# Patient Record
Sex: Male | Born: 1995 | Race: Black or African American | Hispanic: No | Marital: Single | Smoking: Current every day smoker
Health system: Southern US, Community
[De-identification: ages and names within clinical notes are randomized; demographics above are authoritative.]

## PROBLEM LIST (undated history)

## (undated) DIAGNOSIS — F1290 Cannabis use, unspecified, uncomplicated: Secondary | ICD-10-CM

## (undated) DIAGNOSIS — F17210 Nicotine dependence, cigarettes, uncomplicated: Secondary | ICD-10-CM

---
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
## 2021-06-13 ENCOUNTER — Encounter (HOSPITAL_COMMUNITY): Payer: Self-pay | Admitting: Emergency Medicine

## 2021-06-13 ENCOUNTER — Observation Stay (HOSPITAL_COMMUNITY)
Admission: EM | Admit: 2021-06-13 | Discharge: 2021-06-14 | Disposition: A | Discharge status: Home / Self Care | Payer: PRIVATE HEALTH INSURANCE | Attending: Emergency Medicine | Admitting: Emergency Medicine

## 2021-06-13 DIAGNOSIS — F1290 Cannabis use, unspecified, uncomplicated: Secondary | ICD-10-CM | POA: Diagnosis not present

## 2021-06-13 DIAGNOSIS — D72829 Elevated white blood cell count, unspecified: Secondary | ICD-10-CM | POA: Diagnosis not present

## 2021-06-13 DIAGNOSIS — R0902 Hypoxemia: Secondary | ICD-10-CM | POA: Diagnosis not present

## 2021-06-13 DIAGNOSIS — Z20822 Contact with and (suspected) exposure to covid-19: Secondary | ICD-10-CM | POA: Insufficient documentation

## 2021-06-13 DIAGNOSIS — F17210 Nicotine dependence, cigarettes, uncomplicated: Secondary | ICD-10-CM | POA: Diagnosis present

## 2021-06-13 DIAGNOSIS — R0602 Shortness of breath: Secondary | ICD-10-CM | POA: Diagnosis present

## 2021-06-13 DIAGNOSIS — J45901 Unspecified asthma with (acute) exacerbation: Secondary | ICD-10-CM | POA: Diagnosis not present

## 2021-06-13 HISTORY — DX: Cannabis use, unspecified, uncomplicated: F12.90

## 2021-06-13 HISTORY — DX: Nicotine dependence, cigarettes, uncomplicated: F17.210

## 2021-06-13 MED ORDER — ALBUTEROL SULFATE HFA 108 (90 BASE) MCG/ACT IN AERS: 2.0000 | INHALATION_SPRAY | RESPIRATORY_TRACT | Status: DC | PRN

## 2021-06-13 NOTE — ED Triage Notes (Signed)
Pt c/o SHOB, chest tightness x2 days, audible wheezes. Advised similar experience, states from smoking-tobacco & marijuana. States [facility] is worse when lying flat.   Pt 89% on RA, placed on 2L [place]

## 2021-06-13 NOTE — ED Provider Notes (Addendum)
[facility] EMERGENCY DEPARTMENT Provider Note   CSN: [id number] Arrival date & time: 06/13/21  2335     History  Chief Complaint  Patient presents with   Shortness of Breath    Ray Lowe is a 26 y.o. male with a history of tobacco and marijuana use presenting today for 2 days of increasing shortness of breath.  Sudden onset mild at first and without obvious cause on Thursday afternoon.  He then smoked some cigarettes and marijuana and stated the shortness of breath became worse.  He has not tried anything to make it better.  Patient came in because he feels he is barely able to get words out without feeling winded.  Patient states this is happened once before, and he was given a breathing treatment and some form of steroids to which he improved.  Denies chest pain, recent sick contacts, dizziness, lightheadedness, known allergens, other recreational drug use.  Patient has never been evaluated for asthma or other pulmonary conditions.    The history is provided by the patient and medical records.  Shortness of Breath Associated symptoms: cough, diaphoresis and wheezing   Associated symptoms: no abdominal pain, no chest pain, no ear pain, no fever, no rash, no sore throat and no vomiting       Home Medications Prior to Admission medications   Medication Sig Start Date End Date Taking? Authorizing Provider  diphenhydrAMINE (BENADRYL) 25 MG tablet Take 1 tablet (25 mg total) by mouth every 6 (six) hours. 04/17/15   Domenic Moras, PA-C  famotidine (PEPCID) 20 MG tablet Take 1 tablet (20 mg total) by mouth 2 (two) times daily. 04/17/15   Domenic Moras, PA-C  griseofulvin (GRIFULVIN V) 500 MG tablet Take 1 tablet (500 mg total) by mouth daily. For 4 weeks 05/23/15   Harlene Salts, MD  predniSONE (DELTASONE) 20 MG tablet 3 tabs po day one, then 2 tabs daily x 4 days 04/17/15   Domenic Moras, PA-C      Allergies    Other    Review of Systems   Review of Systems   Constitutional:  Positive for chills, diaphoresis and fatigue. Negative for activity change and fever.  HENT:  Positive for congestion and rhinorrhea. Negative for ear discharge, ear pain, sinus pressure, sore throat and trouble swallowing.   Eyes:  Negative for visual disturbance.  Respiratory:  Positive for cough, shortness of breath and wheezing.   Cardiovascular:  Negative for chest pain and palpitations.  Gastrointestinal:  Negative for abdominal pain, constipation, diarrhea, nausea and vomiting.  Musculoskeletal:  Negative for arthralgias and myalgias.  Skin:  Negative for color change and rash.  Neurological:  Negative for dizziness, seizures, syncope and light-headedness.  All other systems reviewed and are negative.  Physical Exam Updated Vital Signs BP 128/82 (BP Location: Left Arm)    Pulse 83    Temp 98 F (36.7 C) (Oral)    Resp 18    Ht 6\' 1"  (1.854 m)    Wt 68 kg    SpO2 95%    BMI 19.79 kg/m  Physical Exam Vitals and nursing note reviewed.  Constitutional:      General: He is not in acute distress.    Appearance: He is well-developed.  HENT:     Head: Normocephalic and atraumatic.     Right Ear: External ear normal. No drainage or tenderness. There is impacted cerumen.     Left Ear: External ear normal. No drainage or tenderness. There is impacted  cerumen.     Ears:     Comments:  Unable to visualize TM due to large amount of cerumen in canals bilaterally    Nose: Congestion and rhinorrhea present. No signs of injury or nasal tenderness.     Mouth/Throat:     Mouth: Mucous membranes are dry. No oral lesions.     Tongue: Tongue does not deviate from midline.     Palate: No mass.     Pharynx: Oropharynx is clear. Uvula midline. No pharyngeal swelling, posterior oropharyngeal erythema or uvula swelling.  Eyes:     Conjunctiva/sclera: Conjunctivae normal.  Cardiovascular:     Rate and Rhythm: Normal rate and regular rhythm.     Pulses: Normal pulses.           Radial pulses are 2+ on the right side and 2+ on the left side.       Posterior tibial pulses are 2+ on the right side and 2+ on the left side.     Heart sounds: Normal heart sounds. No murmur heard. Pulmonary:     Effort: Pulmonary effort is normal. Tachypnea present. No accessory muscle usage or respiratory distress.     Breath sounds: No stridor. Wheezing present.     Comments:  Diffuse wheezing bilaterally audible without direct auscultation Respirations counted as 22/min which does not correlate with vitals documented at 18/min Abdominal:     Palpations: Abdomen is soft.     Tenderness: There is no abdominal tenderness.  Musculoskeletal:        General: No swelling.     Cervical back: Neck supple.     Right lower leg: No edema.     Left lower leg: No edema.  Skin:    General: Skin is warm and dry.     Capillary Refill: Capillary refill takes less than 2 seconds.     Coloration: Skin is not cyanotic or pale.  Neurological:     Mental Status: He is alert and oriented to person, place, and time.  Psychiatric:        Mood and Affect: Mood normal.   ED Results / Procedures / Treatments   Labs (all labs ordered are listed, but only abnormal results are displayed) Labs Reviewed  CBC WITH DIFFERENTIAL/PLATELET - Abnormal; Notable for the following components:      Result Value   WBC 16.4 (*)    Neutro Abs 13.0 (*)    Monocytes Absolute 1.6 (*)    All other components within normal limits  BASIC METABOLIC PANEL - Abnormal; Notable for the following components:   Glucose, Bld 109 (*)    All other components within normal limits  RESP PANEL BY RT-PCR (FLU A&B, COVID) ARPGX2  D-DIMER, QUANTITATIVE  TROPONIN I (HIGH SENSITIVITY)  TROPONIN I (HIGH SENSITIVITY)    EKG EKG Interpretation  Date/Time:  Saturday June 13 2021 23:50:52 EST Ventricular Rate:  74 PR Interval:  189 QRS Duration: 82 QT Interval:  347 QTC Calculation: 385 R Axis:   92 Text Interpretation: Sinus  rhythm Probable left atrial enlargement Borderline right axis deviation No significant change was found Confirmed by Ezequiel Essex [phone]) on 06/13/2021 11:58:08 PM  Radiology DG Chest 2 View  Result Date: 06/14/2021 CLINICAL DATA:  Shortness of breath EXAM: CHEST - 2 VIEW COMPARISON:  None. FINDINGS: The heart size and mediastinal contours are within normal limits. Both lungs are clear. The visualized skeletal structures are unremarkable. IMPRESSION: No active cardiopulmonary disease. Electronically Signed   By: Lennette Bihari  [place] M.D.   On: 06/14/2021 00:20    Procedures Procedures    Medications Ordered in ED Medications  albuterol (VENTOLIN HFA) 108 (90 Base) MCG/ACT inhaler 2 puff (has no administration in time range)  magnesium sulfate IVPB 2 g 50 mL (2 g Intravenous New Bag/Given 06/14/21 0029)  albuterol (PROVENTIL) (2.5 MG/3ML) 0.083% nebulizer solution (has no administration in time range)  ipratropium (ATROVENT) nebulizer solution 0.5 mg (has no administration in time range)  ipratropium-albuterol (DUONEB) 0.5-2.5 (3) MG/3ML nebulizer solution 3 mL (3 mLs Nebulization Given 06/14/21 0022)  methylPREDNISolone sodium succinate (SOLU-MEDROL) 125 mg/2 mL injection 125 mg (125 mg Intravenous Given 06/14/21 0030)  AeroChamber Plus Flo-Vu Large MISC (1 each  Given 06/14/21 0030)    ED Course/ Medical Decision Making/ A&P                           Medical Decision Making Problems Addressed: SOB (shortness of breath): acute illness or injury with systemic symptoms  Amount and/or Complexity of Data Reviewed External Data Reviewed: notes. Labs: ordered. Decision-making details documented in ED Course. Radiology: ordered and independent interpretation performed. Decision-making details documented in ED Course. ECG/medicine tests: ordered and independent interpretation performed. Decision-making details documented in ED Course.  Risk Prescription drug management. Decision regarding  hospitalization.   26 year old male presents to the ED for concern of increasing shortness of breath and fatigue.  This involves an extensive number of treatment options, and is a complaint that carries with it a high risk of complications and morbidity.  The differential diagnosis includes asthma exacerbation, pulmonary embolism, pneumothorax, bronchitis, or pneumonia.  Additional history obtained from internal/external records available via epic  I ordered, and personally interpreted labs.  The pertinent results include: Troponin value of 3, which is not suggestive of a myocardial infarction or acute cardiac event.  COVID and flu were negative.  WBC count at 16.4, which is suggestive of fighting infection and correlates with patient's presentation and history.  Electrolytes unremarkable.  Though the patient has subjective fatigue, not likely due to anemia based on a hemoglobin of 16.5 and a hematocrit of 48.9.  I ordered imaging studies including chest x-ray and EKG.  I independently visualized and interpreted the EKG which did not illustrate a Q wave or negative T wave in lead III, or an S wave in lead I, and patient was nontachycardic, thus I do not believe it is likely the patient is experiencing a pulmonary embolism.  I independently visualized and interpreted the chest x-ray as well, which did not demonstrate acute cardiopulmonary disease, so I do not believe pneumonia is likely at this time.  I agree with the radiologist interpretation  Patient's physical exam demonstrated diffuse wheezing in all lobes bilaterally, which is unlikely a presentation for pulmonary embolism or pneumothorax.  I ordered medication including DuoNeb, magnesium sulfate, and Solu-Medrol for bronchospasm treatment.  Reevaluation of the patient after these medicines showed that the patient mildly improved but was still having some difficulty with shortness of breath.  A continuous albuterol nebulizer and another dose of  Atrovent was provided.    Another reevaluation showed more improvement.  Wheezes have lessened and are no longer audible without a stethoscope.  Will have patient attempt to walk without oxygen and reassess.  After the interventions noted above, I reevaluated the patient and found that they were still hypoxic with O2 sats around 90 on room air and audible wheeze.  Patient still experiencing shortness of  breath after several rounds of steroids and breathing treatments.  Do not feel comfortable discharging the patient with his current hypoxia and continued shortness of breath and wheeze at this time without further evaluation and treatment.  I considered ordering CTA of the chest, but due to noted improvement on medication, patient history, and lack of physical exam evidence suggestive for pulmonary embolism, I decided this was not necessary at this time.  Dispostion: I discussed the patient and their case with my attending, Dr. Wyvonnia Dusky, who agreed with the proposed treatment course.  After consideration of the diagnostic results and the patients response to treatment, I feel that the patent would benefit from admission for further treatment and evaluation of his hypoxia and shortness of breath.  Discussed course of treatment thoroughly with the patient and he demonstrated understanding.  Patient in agreement and has no further questions.        Final Clinical Impression(s) / ED Diagnoses Final diagnoses:  SOB (shortness of breath)  Exacerbation of asthma, unspecified asthma severity, unspecified whether persistent  Hypoxia   Rx / DC Orders ED Discharge Orders     None         Candace Cruise [id number]    Ezequiel Essex, MD 06/14/21 [phone]   Addendum: After patient care was transferred to hospitalist group, patient decided not to continue with admission process.  Was informed that the patient was sent on being discharged due to needing to coordinate rides with  outside persons.  Patient left AGAINST MEDICAL ADVICE.   Prince Rome, PA-C [id number]    Ezequiel Essex, MD 06/14/21 [phone]

## 2021-06-13 NOTE — ED Notes (Signed)
O2 91% on 2L, O2 increased to 4L [place]

## 2021-06-14 ENCOUNTER — Other Ambulatory Visit: Payer: Self-pay

## 2021-06-14 ENCOUNTER — Emergency Department (HOSPITAL_COMMUNITY): Payer: PRIVATE HEALTH INSURANCE

## 2021-06-14 ENCOUNTER — Encounter (HOSPITAL_COMMUNITY): Payer: Self-pay | Admitting: Internal Medicine

## 2021-06-14 DIAGNOSIS — F17210 Nicotine dependence, cigarettes, uncomplicated: Secondary | ICD-10-CM | POA: Diagnosis not present

## 2021-06-14 DIAGNOSIS — J45901 Unspecified asthma with (acute) exacerbation: Secondary | ICD-10-CM | POA: Diagnosis present

## 2021-06-14 DIAGNOSIS — D72829 Elevated white blood cell count, unspecified: Secondary | ICD-10-CM

## 2021-06-14 DIAGNOSIS — F1290 Cannabis use, unspecified, uncomplicated: Secondary | ICD-10-CM | POA: Diagnosis not present

## 2021-06-14 LAB — CBC WITH DIFFERENTIAL/PLATELET
Abs Immature Granulocytes: 0.05 10*3/uL (ref 0.00–0.07)
Basophils Absolute: 0.1 10*3/uL (ref 0.0–0.1)
Basophils Relative: 0 %
Eosinophils Absolute: 0.2 10*3/uL (ref 0.0–0.5)
Eosinophils Relative: 1 %
HCT: 48.9 % (ref 39.0–52.0)
Hemoglobin: 16.5 g/dL (ref 13.0–17.0)
Immature Granulocytes: 0 %
Lymphocytes Relative: 9 %
Lymphs Abs: 1.4 10*3/uL (ref 0.7–4.0)
MCH: 28.8 pg (ref 26.0–34.0)
MCHC: 33.7 g/dL (ref 30.0–36.0)
MCV: 85.3 fL (ref 80.0–100.0)
Monocytes Absolute: 1.6 10*3/uL — ABNORMAL HIGH (ref 0.1–1.0)
Monocytes Relative: 10 %
Neutro Abs: 13 10*3/uL — ABNORMAL HIGH (ref 1.7–7.7)
Neutrophils Relative %: 80 %
Platelets: 191 10*3/uL (ref 150–400)
RBC: 5.73 MIL/uL (ref 4.22–5.81)
RDW: 12.6 % (ref 11.5–15.5)
WBC: 16.4 10*3/uL — ABNORMAL HIGH (ref 4.0–10.5)
nRBC: 0 % (ref 0.0–0.2)

## 2021-06-14 LAB — BASIC METABOLIC PANEL
Anion gap: 10 (ref 5–15)
BUN: 8 mg/dL (ref 6–20)
CO2: 23 mmol/L (ref 22–32)
Calcium: 9.4 mg/dL (ref 8.9–10.3)
Chloride: 104 mmol/L (ref 98–111)
Creatinine, Ser: 0.94 mg/dL (ref 0.61–1.24)
GFR, Estimated: >60 mL/min (ref >60)
Glucose, Bld: 109 mg/dL — ABNORMAL HIGH (ref 70–99)
Potassium: 3.6 mmol/L (ref 3.5–5.1)
Sodium: 137 mmol/L (ref 135–145)

## 2021-06-14 LAB — RESP PANEL BY RT-PCR (FLU A&B, COVID) ARPGX2
Influenza A by PCR: NEGATIVE
Influenza B by PCR: NEGATIVE
SARS Coronavirus 2 by RT PCR: NEGATIVE

## 2021-06-14 LAB — HIV ANTIBODY (ROUTINE TESTING W REFLEX): HIV Screen 4th Generation wRfx: NONREACTIVE

## 2021-06-14 LAB — TROPONIN I (HIGH SENSITIVITY)
Troponin I (High Sensitivity): 3 ng/L (ref <18)
Troponin I (High Sensitivity): <2 ng/L (ref <18)

## 2021-06-14 LAB — D-DIMER, QUANTITATIVE: D-Dimer, Quant: <0.27 ug/mL-FEU (ref 0.00–0.50)

## 2021-06-14 MED ORDER — IPRATROPIUM BROMIDE 0.02 % IN SOLN
0.5000 mg | Freq: Once | RESPIRATORY_TRACT | Status: AC
  Administered 2021-06-14: 0.5 mg via RESPIRATORY_TRACT
  Filled 2021-06-14: qty 2.5

## 2021-06-14 MED ORDER — ALBUTEROL SULFATE (2.5 MG/3ML) 0.083% IN NEBU
10.0000 mg/h | INHALATION_SOLUTION | RESPIRATORY_TRACT | Status: DC
  Administered 2021-06-14: 10 mg/h via RESPIRATORY_TRACT
  Filled 2021-06-14: qty 12

## 2021-06-14 MED ORDER — ALBUTEROL SULFATE HFA 108 (90 BASE) MCG/ACT IN AERS: 1.0000 | INHALATION_SPRAY | Freq: Four times a day (QID) | RESPIRATORY_TRACT | 0 refills | Status: AC | PRN

## 2021-06-14 MED ORDER — ACETAMINOPHEN 325 MG PO TABS: 650.0000 mg | ORAL_TABLET | Freq: Four times a day (QID) | ORAL | Status: DC | PRN

## 2021-06-14 MED ORDER — POLYETHYLENE GLYCOL 3350 17 G PO PACK: 17.0000 g | PACK | Freq: Every day | ORAL | Status: DC | PRN

## 2021-06-14 MED ORDER — MAGNESIUM SULFATE 2 GM/50ML IV SOLN
2.0000 g | Freq: Once | INTRAVENOUS | Status: AC
  Administered 2021-06-14: 2 g via INTRAVENOUS
  Filled 2021-06-14: qty 50

## 2021-06-14 MED ORDER — METHYLPREDNISOLONE SODIUM SUCC 125 MG IJ SOLR
125.0000 mg | Freq: Once | INTRAMUSCULAR | Status: AC
  Administered 2021-06-14: 125 mg via INTRAVENOUS
  Filled 2021-06-14: qty 2

## 2021-06-14 MED ORDER — PREDNISONE 20 MG PO TABS: 50.0000 mg | ORAL_TABLET | Freq: Every day | ORAL | Status: DC

## 2021-06-14 MED ORDER — ONDANSETRON HCL 4 MG PO TABS: 4.0000 mg | ORAL_TABLET | Freq: Four times a day (QID) | ORAL | Status: DC | PRN

## 2021-06-14 MED ORDER — IPRATROPIUM-ALBUTEROL 0.5-2.5 (3) MG/3ML IN SOLN
3.0000 mL | Freq: Once | RESPIRATORY_TRACT | Status: AC
  Administered 2021-06-14: 3 mL via RESPIRATORY_TRACT
  Filled 2021-06-14: qty 3

## 2021-06-14 MED ORDER — LACTATED RINGERS IV SOLN: INTRAVENOUS | Status: DC

## 2021-06-14 MED ORDER — ONDANSETRON HCL 4 MG/2ML IJ SOLN: 4.0000 mg | Freq: Four times a day (QID) | INTRAMUSCULAR | Status: DC | PRN

## 2021-06-14 MED ORDER — PREDNISONE 50 MG PO TABS: ORAL_TABLET | ORAL | 0 refills | Status: DC

## 2021-06-14 MED ORDER — ENOXAPARIN SODIUM 40 MG/0.4ML IJ SOSY: 40.0000 mg | PREFILLED_SYRINGE | INTRAMUSCULAR | Status: DC

## 2021-06-14 MED ORDER — ALBUTEROL SULFATE (2.5 MG/3ML) 0.083% IN NEBU: 2.5000 mg | INHALATION_SOLUTION | Freq: Four times a day (QID) | RESPIRATORY_TRACT | Status: DC

## 2021-06-14 MED ORDER — ACETAMINOPHEN 650 MG RE SUPP: 650.0000 mg | Freq: Four times a day (QID) | RECTAL | Status: DC | PRN

## 2021-06-14 MED ORDER — AEROCHAMBER PLUS FLO-VU LARGE MISC
Status: AC
  Administered 2021-06-14: 1
  Filled 2021-06-14: qty 1

## 2021-06-14 NOTE — ED Notes (Signed)
Pulse oximetry while ambulatory stayed at 89-91%. Dr. Manus Gunning made aware.

## 2021-06-14 NOTE — Assessment & Plan Note (Addendum)
·   Patient presenting with severe wheezing as well as oxygen requirement  Clinically, patient seems to have undiagnosed asthma and is now suffering an exacerbation  Exacerbations are likely brought about by patient's smoking and marijuana use habits  COVID-19 PCR testing unremarkable  Chest x-ray unremarkable  D-dimer unremarkable  Patient is unfortunately requesting leave AGAINST MEDICAL ADVICE.  I have advised the patient to remain hospitalized considering his oxygen requirement however patient wishes to leave anyway.  Patient has voiced his understanding that he places himself at risk of morbidity mortality by leaving.  I have advised the patient to seek medical attention as soon as able.

## 2021-06-14 NOTE — Assessment & Plan Note (Signed)
·   Counseled sternly about cessation of this as well smoking marijuana is likely also precipitated the patient's bronchospasms

## 2021-06-14 NOTE — H&P (Signed)
History and Physical  -Initial consultation note   Ray Lowe [id number] DOB: 24-May-1995 DOA: 06/13/2021  PCP[facility]  Patient coming from: Home   REQUESTING PROVIDER: Dr. Manus Gunning - Emergency Department Reason for Consult: Shortness of Breath  Chief Complaint:  Chief Complaint  Patient presents with   Shortness of Breath     HPI:    26 year old male with no past medical history of nicotine dependence and marijuana use presenting to [facility] emergency department with complaints of shortness of breath.  Patient explains that for the past 2 days he has been experiencing shortness of breath.  Shortness of breath was initially mild in intensity but progressively became more and more severe over the following 2 days.  Patient is complaining of associated nonproductive cough as well as wheezing.  Patient also is complaining of associated dyspnea on exertion.  Patient denies peripheral edema, paroxysmal nocturnal dyspnea, pillow orthopnea, fevers, sick contacts, recent travel or contact with confirmed COVID-19 infection.  Patient continued to worsen and eventually presented to [facility] emergency department for evaluation.  Upon evaluation in the emergency department patient was found to be quite short of breath and actively wheezing with pulse oximetry noted to be 87% on room air.  Patient was placed on supplemental oxygen has been given several rounds of aggressive bronchodilator therapy.  Patient was additionally given 125 mg of Solu-Medrol as well as 2 g of magnesium sulfate.  Due to continued wheezing and shortness of breath the hospitalist group was then called to assess the patient for admission to the hospital.  Review of Systems:   Review of Systems  Respiratory:  Positive for cough, shortness of breath and wheezing.   All other systems reviewed and are negative.  Past Medical History:  Diagnosis Date   Marijuana use     Nicotine dependence, cigarettes, uncomplicated     History reviewed. No pertinent surgical history.   reports that he has been smoking cigarettes. He does not have any smokeless tobacco history on file. He reports current drug use. Drug: Marijuana. He reports that he does not drink alcohol.  Allergies  Allergen Reactions   Other Swelling    Seafood. "Eyes get puffy."      Family History  Problem Relation Age of Onset   Heart disease Neg Hx      Prior to Admission medications   Medication Sig Start Date End Date Taking? Authorizing Provider  diphenhydrAMINE (BENADRYL) 25 MG tablet Take 1 tablet (25 mg total) by mouth every 6 (six) hours. 04/17/15   Fayrene Helper, PA-C  famotidine (PEPCID) 20 MG tablet Take 1 tablet (20 mg total) by mouth 2 (two) times daily. 04/17/15   Fayrene Helper, PA-C  griseofulvin (GRIFULVIN V) 500 MG tablet Take 1 tablet (500 mg total) by mouth daily. For 4 weeks 05/23/15   Ree Shay, MD  predniSONE (DELTASONE) 20 MG tablet 3 tabs po day one, then 2 tabs daily x 4 days 04/17/15   Fayrene Helper, PA-C    Physical Exam: Vitals:   06/14/21 0300 06/14/21 0400 06/14/21 0415 06/14/21 0500  BP:  107/62 (!) 122/57 111/61  Pulse:  (!) 117 (!) 111 94  Resp:  20 (!) 23 (!) 27  Temp:      TempSrc:      SpO2: (!) 89% 90% 92% (!) 89%  Weight:      Height:        Constitutional: Awake alert and oriented x3, no  associated distress.   Skin: no rashes, no lesions, good skin turgor noted. Eyes: Pupils are equally reactive to light.  No evidence of scleral icterus or conjunctival pallor.  ENMT: Moist mucous membranes noted.  Posterior pharynx clear of any exudate or lesions.   Neck: normal, supple, no masses, no thyromegaly.  No evidence of jugular venous distension.   Respiratory: Diffuse wheezing without rales.  Normal respiratory effort. No accessory muscle use.  Cardiovascular: Regular rate and rhythm, no murmurs / rubs / gallops. No extremity edema. 2+ pedal pulses. No  carotid bruits.  Chest:   Nontender without crepitus or deformity.   Back:   Nontender without crepitus or deformity. Abdomen: Abdomen is soft and nontender.  No evidence of intra-abdominal masses.  Positive bowel sounds noted in all quadrants.   Musculoskeletal: No joint deformity upper and lower extremities. Good ROM, no contractures. Normal muscle tone.  Neurologic: CN 2-12 grossly intact. Sensation intact.  Patient moving all 4 extremities spontaneously.  Patient is following all commands.  Patient is responsive to verbal stimuli.   Psychiatric: Patient exhibits normal mood with appropriate affect.  Patient seems to possess insight as to their current situation.     Labs on Admission: I have personally reviewed following labs and imaging studies -   CBC: Recent Labs  Lab 06/14/21 0033  WBC 16.4*  NEUTROABS 13.0*  HGB 16.5  HCT 48.9  MCV 85.3  PLT 191   Basic Metabolic Panel: Recent Labs  Lab 06/14/21 0033  NA 137  K 3.6  CL 104  CO2 23  GLUCOSE 109*  BUN 8  CREATININE 0.94  CALCIUM 9.4   GFR: Estimated Creatinine Clearance: 117.6 mL/min (by C-G formula based on SCr of 0.94 mg/dL). Liver Function Tests: No results for input(s): AST, ALT, ALKPHOS, BILITOT, PROT, ALBUMIN in the last 168 hours. No results for input(s): LIPASE, AMYLASE in the last 168 hours. No results for input(s): AMMONIA in the last 168 hours. Coagulation Profile: No results for input(s): INR, PROTIME in the last 168 hours. Cardiac Enzymes: No results for input(s): CKTOTAL, CKMB, CKMBINDEX, TROPONINI in the last 168 hours. BNP (last 3 results) No results for input(s): PROBNP in the last 8760 hours. HbA1C: No results for input(s): HGBA1C in the last 72 hours. CBG: No results for input(s): GLUCAP in the last 168 hours. Lipid Profile: No results for input(s): CHOL, HDL, LDLCALC, TRIG, CHOLHDL, LDLDIRECT in the last 72 hours. Thyroid Function Tests: No results for input(s): TSH, T4TOTAL, FREET4,  T3FREE, THYROIDAB in the last 72 hours. Anemia Panel: No results for input(s): VITAMINB12, FOLATE, FERRITIN, TIBC, IRON, RETICCTPCT in the last 72 hours. Urine analysis: No results found for: COLORURINE, APPEARANCEUR, LABSPEC, PHURINE, GLUCOSEU, HGBUR, BILIRUBINUR, KETONESUR, PROTEINUR, UROBILINOGEN, NITRITE, LEUKOCYTESUR  Radiological Exams on Admission - Personally Reviewed: DG Chest 2 View  Result Date: 06/14/2021 CLINICAL DATA:  Shortness of breath EXAM: CHEST - 2 VIEW COMPARISON:  None. FINDINGS: The heart size and mediastinal contours are within normal limits. Both lungs are clear. The visualized skeletal structures are unremarkable. IMPRESSION: No active cardiopulmonary disease. Electronically Signed   By: Deatra Robinson M.D.   On: 06/14/2021 00:20    EKG: Personally reviewed.  Rhythm is normal sinus rhythm with heart rate of 74 bpm.  No dynamic ST segment changes appreciated.  Assessment/Plan  * Acute asthma exacerbation- (present on admission) Patient presenting with severe wheezing as well as oxygen requirement Clinically, patient seems to have undiagnosed asthma and is now suffering an exacerbation Exacerbations  are likely brought about by patient's smoking and marijuana use habits COVID-19 PCR testing unremarkable Chest x-ray unremarkable D-dimer unremarkable Patient is unfortunately requesting leave AGAINST MEDICAL ADVICE.  I have advised the patient to remain hospitalized considering his oxygen requirement however patient wishes to leave anyway.  Patient has voiced his understanding that he places himself at risk of morbidity mortality by leaving.  I have advised the patient to seek medical attention as soon as able.  Nicotine dependence, cigarettes, uncomplicated- (present on admission) Counseled sternly on cessation and the fact that this is likely causing the patient's substantial bronchospasm  Marijuana use- (present on admission) Counseled sternly about cessation of this  as well smoking marijuana is likely also precipitated the patient's bronchospasms  Leukocytosis- (present on admission) No clinical evidence of bacterial infection Blood was drawn several hours after steroid administration and is likely the cause of the leukocytosis       Thank you for this consultation, we will follow the patient along with you until patient is discharged.       Marinda ElkGeorge J Jabe Jeanbaptiste MD [facility] Pager [phone]  If 7PM-7AM, please contact night-coverage [email or web address] Use universal [place] password for that web site. If you do not have the password, please call the hospital operator.  06/14/2021, 7:16 AM

## 2021-06-14 NOTE — Assessment & Plan Note (Signed)
·   No clinical evidence of bacterial infection  Blood was drawn several hours after steroid administration and is likely the cause of the leukocytosis

## 2021-06-14 NOTE — ED Notes (Signed)
Patient left against medical advice, states has family affair to attend to and he will returned. Patient informed about the risks of leaving while not fully recovered and still needing O2 to keep his Spo2 above 90%. Patient expressed that he understand the situation he is in but still want to leave. Patient informed that he can still come back when he needs to.

## 2021-06-14 NOTE — Assessment & Plan Note (Signed)
·   Counseled sternly on cessation and the fact that this is likely causing the patient's substantial bronchospasm

## 2022-02-25 ENCOUNTER — Other Ambulatory Visit: Payer: Self-pay

## 2022-02-25 ENCOUNTER — Emergency Department (HOSPITAL_COMMUNITY)
Admission: EM | Admit: 2022-02-25 | Discharge: 2022-02-25 | Disposition: A | Discharge status: Home / Self Care | Payer: PRIVATE HEALTH INSURANCE | Attending: Emergency Medicine | Admitting: Emergency Medicine

## 2022-02-25 ENCOUNTER — Encounter (HOSPITAL_COMMUNITY): Payer: Self-pay | Admitting: Emergency Medicine

## 2022-02-25 ENCOUNTER — Ambulatory Visit (HOSPITAL_COMMUNITY)
Admission: EM | Admit: 2022-02-25 | Discharge: 2022-02-25 | Disposition: A | Discharge status: Home / Self Care | Payer: PRIVATE HEALTH INSURANCE | Attending: Emergency Medicine | Admitting: Emergency Medicine

## 2022-02-25 DIAGNOSIS — J069 Acute upper respiratory infection, unspecified: Secondary | ICD-10-CM | POA: Insufficient documentation

## 2022-02-25 DIAGNOSIS — R630 Anorexia: Secondary | ICD-10-CM | POA: Insufficient documentation

## 2022-02-25 DIAGNOSIS — Z5321 Procedure and treatment not carried out due to patient leaving prior to being seen by health care provider: Secondary | ICD-10-CM | POA: Diagnosis not present

## 2022-02-25 DIAGNOSIS — Z1152 Encounter for screening for COVID-19: Secondary | ICD-10-CM | POA: Insufficient documentation

## 2022-02-25 DIAGNOSIS — R112 Nausea with vomiting, unspecified: Secondary | ICD-10-CM | POA: Insufficient documentation

## 2022-02-25 LAB — COMPREHENSIVE METABOLIC PANEL
ALT: 13 U/L (ref 0–44)
AST: 19 U/L (ref 15–41)
Albumin: 4.6 g/dL (ref 3.5–5.0)
Alkaline Phosphatase: 62 U/L (ref 38–126)
Anion gap: 11 (ref 5–15)
BUN: 10 mg/dL (ref 6–20)
CO2: 21 mmol/L — ABNORMAL LOW (ref 22–32)
Calcium: 9.8 mg/dL (ref 8.9–10.3)
Chloride: 105 mmol/L (ref 98–111)
Creatinine, Ser: 0.97 mg/dL (ref 0.61–1.24)
GFR, Estimated: >60 mL/min (ref >60)
Glucose, Bld: 98 mg/dL (ref 70–99)
Potassium: 3.8 mmol/L (ref 3.5–5.1)
Sodium: 137 mmol/L (ref 135–145)
Total Bilirubin: 1 mg/dL (ref 0.3–1.2)
Total Protein: 7.8 g/dL (ref 6.5–8.1)

## 2022-02-25 LAB — LIPASE, BLOOD: Lipase: 25 U/L (ref 11–51)

## 2022-02-25 LAB — URINALYSIS, ROUTINE W REFLEX MICROSCOPIC
Bacteria, UA: NONE SEEN
Bilirubin Urine: NEGATIVE
Glucose, UA: NEGATIVE mg/dL
Hgb urine dipstick: NEGATIVE
Ketones, ur: 20 mg/dL — AB
Leukocytes,Ua: NEGATIVE
Nitrite: NEGATIVE
Protein, ur: 30 mg/dL — AB
Specific Gravity, Urine: 1.026 (ref 1.005–1.030)
pH: 6 (ref 5.0–8.0)

## 2022-02-25 LAB — CBC
HCT: 48.2 % (ref 39.0–52.0)
Hemoglobin: 16.3 g/dL (ref 13.0–17.0)
MCH: 29.2 pg (ref 26.0–34.0)
MCHC: 33.8 g/dL (ref 30.0–36.0)
MCV: 86.2 fL (ref 80.0–100.0)
Platelets: 202 10*3/uL (ref 150–400)
RBC: 5.59 MIL/uL (ref 4.22–5.81)
RDW: 13 % (ref 11.5–15.5)
WBC: 13.8 10*3/uL — ABNORMAL HIGH (ref 4.0–10.5)
nRBC: 0 % (ref 0.0–0.2)

## 2022-02-25 MED ORDER — GUAIFENESIN 100 MG/5ML PO LIQD: 15.0000 mL | ORAL | 0 refills | Status: AC | PRN

## 2022-02-25 NOTE — Discharge Instructions (Addendum)
I recommend using Robitussin every 4 hours to loosen mucous in the chest and help with cough.  You can also try a daily allergy medicine such as Zyrtec or allegra.  Please return if symptoms persist despite home treatment.

## 2022-02-25 NOTE — ED Provider Notes (Signed)
[place]    CSN: [id number] Arrival date & time: 02/25/22  1126     History   Chief Complaint Chief Complaint  Patient presents with   Cough    HPI Ray Lowe is a 26 y.o. male.  2 day history of nasal congestion and cough Mucous cough, feels fullness in the chest Denies fevers Tried tylenol at home, no other medications  No abdominal pain, nausea/vomiting   Smoker, no history of asthma  Was seen in the ED a few hours ago but left before being seen. Went for nausea vomiting although patient denies these symptoms Normal lipase and CMP.  Slight elevation in white count, 13.8 Urinalysis unremarkable  Brother with similar symptoms Works around a lot of people  Past Medical History:  Diagnosis Date   Marijuana use    Nicotine dependence, cigarettes, uncomplicated     Patient Active Problem List   Diagnosis Date Noted   Acute asthma exacerbation 06/14/2021   Nicotine dependence, cigarettes, uncomplicated 16/05/930   Marijuana use 06/14/2021   Leukocytosis 06/14/2021    History reviewed. No pertinent surgical history.     Home Medications    Prior to Admission medications   Medication Sig Start Date End Date Taking? Authorizing Provider  guaiFENesin (ROBITUSSIN) 100 MG/5ML liquid Take 15 mLs by mouth every 4 (four) hours as needed for cough or to loosen phlegm. 02/25/22  Yes Khalani Novoa, Wells Guiles, PA-C  albuterol (VENTOLIN HFA) 108 (90 Base) MCG/ACT inhaler Inhale 1-2 puffs into the lungs every 6 (six) hours as needed for wheezing or shortness of breath. 06/14/21   Rancour, Annie Main, MD    Family History Family History  Problem Relation Age of Onset   Heart disease Neg Hx     Social History Social History   Tobacco Use   Smoking status: Every Day    Types: Cigarettes  Substance Use Topics   Alcohol use: No   Drug use: Yes    Types: Marijuana     Allergies   Other   Review of Systems Review of Systems Per HPI  Physical  Exam Triage Vital Signs ED Triage Vitals  Enc Vitals Group     BP      Pulse      Resp      Temp      Temp src      SpO2      Weight      Height      Head Circumference      Peak Flow      Pain Score      Pain Loc      Pain Edu?      Excl. in [place]?    No data found.  Updated Vital Signs BP 124/74 (BP Location: Left Arm)   Pulse 87   Temp 98.4 F (36.9 C) (Oral)   Resp 14   SpO2 97%    Physical Exam Vitals and nursing note reviewed.  Constitutional:      General: He is not in acute distress. HENT:     Mouth/Throat:     Mouth: Mucous membranes are moist.     Pharynx: Oropharynx is clear. Uvula midline. No posterior oropharyngeal erythema.     Tonsils: No tonsillar exudate or tonsillar abscesses.  Eyes:     Conjunctiva/sclera: Conjunctivae normal.  Cardiovascular:     Rate and Rhythm: Normal rate and regular rhythm.     Heart sounds: Normal heart sounds.  Pulmonary:     Effort:  Pulmonary effort is normal. No respiratory distress.     Breath sounds: Normal breath sounds. No wheezing.  Musculoskeletal:     Cervical back: Normal range of motion.  Lymphadenopathy:     Cervical: No cervical adenopathy.  Skin:    General: Skin is warm and dry.  Neurological:     Mental Status: He is alert and oriented to person, place, and time.     UC Treatments / Results  Labs (all labs ordered are listed, but only abnormal results are displayed) Labs Reviewed  SARS CORONAVIRUS 2 (TAT 6-24 HRS)    EKG  Radiology No results found.  Procedures Procedures (including critical care time)  Medications Ordered in UC Medications - No data to display  Initial Impression / Assessment and Plan / UC Course  I have reviewed the triage vital signs and the nursing notes.  Pertinent labs & imaging results that were available during my care of the patient were reviewed by me and considered in my medical decision making (see chart for details).  Well appearing Could be viral  etiology Recommend symptomatic care at home, either mucinex pills or the robitussin liquid.  Sent robitussin to use q4 hours PRN Work note provided Return precautions discussed. Patient agrees to plan  Final Clinical Impressions(s) / UC Diagnoses   Final diagnoses:  Viral URI with cough     Discharge Instructions      I recommend using Robitussin every 4 hours to loosen mucous in the chest and help with cough.  You can also try a daily allergy medicine such as Zyrtec or allegra.  Please return if symptoms persist despite home treatment.     ED Prescriptions     Medication Sig Dispense Auth. Provider   guaiFENesin (ROBITUSSIN) 100 MG/5ML liquid Take 15 mLs by mouth every 4 (four) hours as needed for cough or to loosen phlegm. 180 mL Williette Loewe, Wells Guiles, PA-C      PDMP not reviewed this encounter.   Tamarcus Condie, Vernice Jefferson 02/25/22 1254

## 2022-02-25 NOTE — ED Triage Notes (Signed)
Pt. Stated, for 2 days Ive not been able to sleep and Ive had no appetite with N/V .

## 2022-02-25 NOTE — ED Triage Notes (Signed)
Pt c/o cough and congestion since Tuesday. Coughing up mucous.

## 2022-02-26 LAB — SARS CORONAVIRUS 2 (TAT 6-24 HRS): SARS Coronavirus 2: NEGATIVE

## 2023-02-04 IMAGING — CR DG CHEST 2V
2 series · 2 of 2 positions shown · non-contrast
Comparison: None.

CLINICAL DATA: Shortness of breath

EXAM:
CHEST - 2 VIEW

[chest lat]
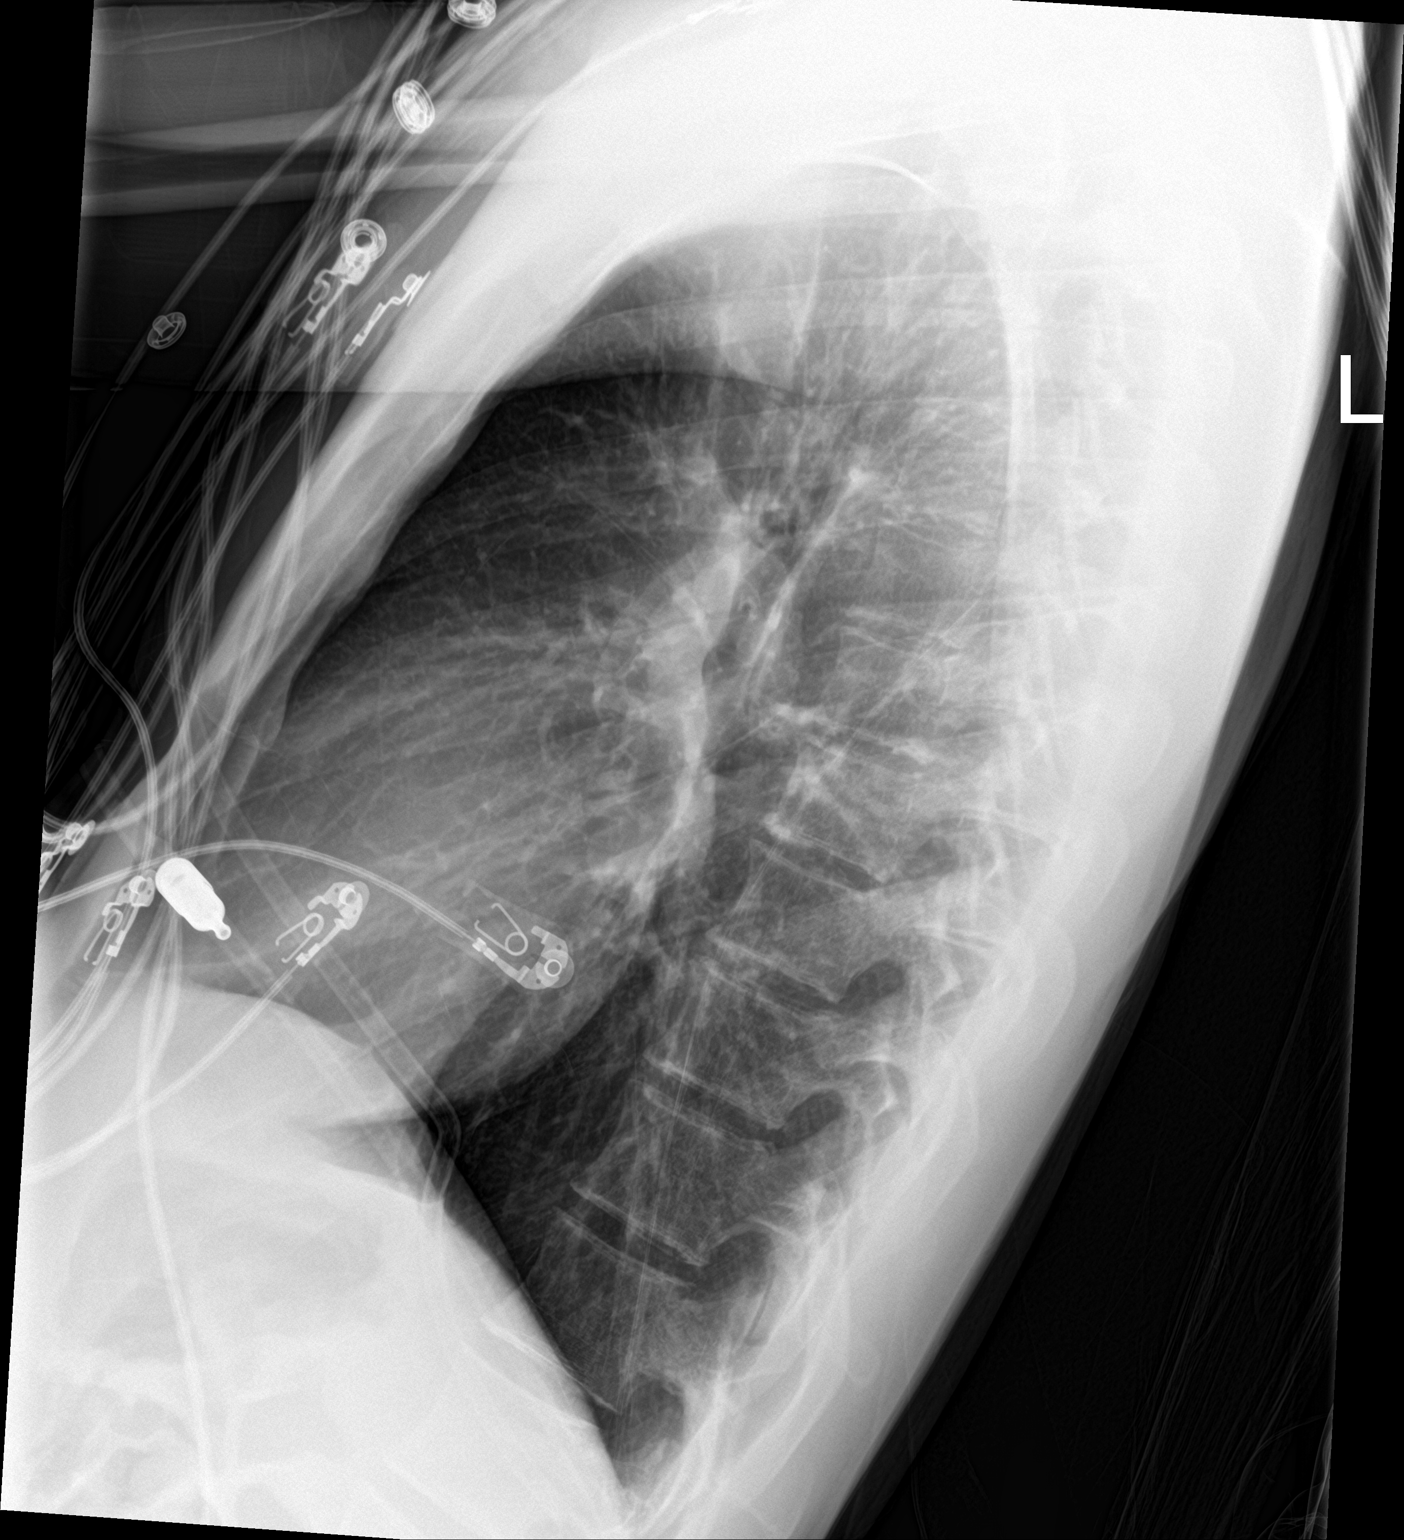

[chest ap]
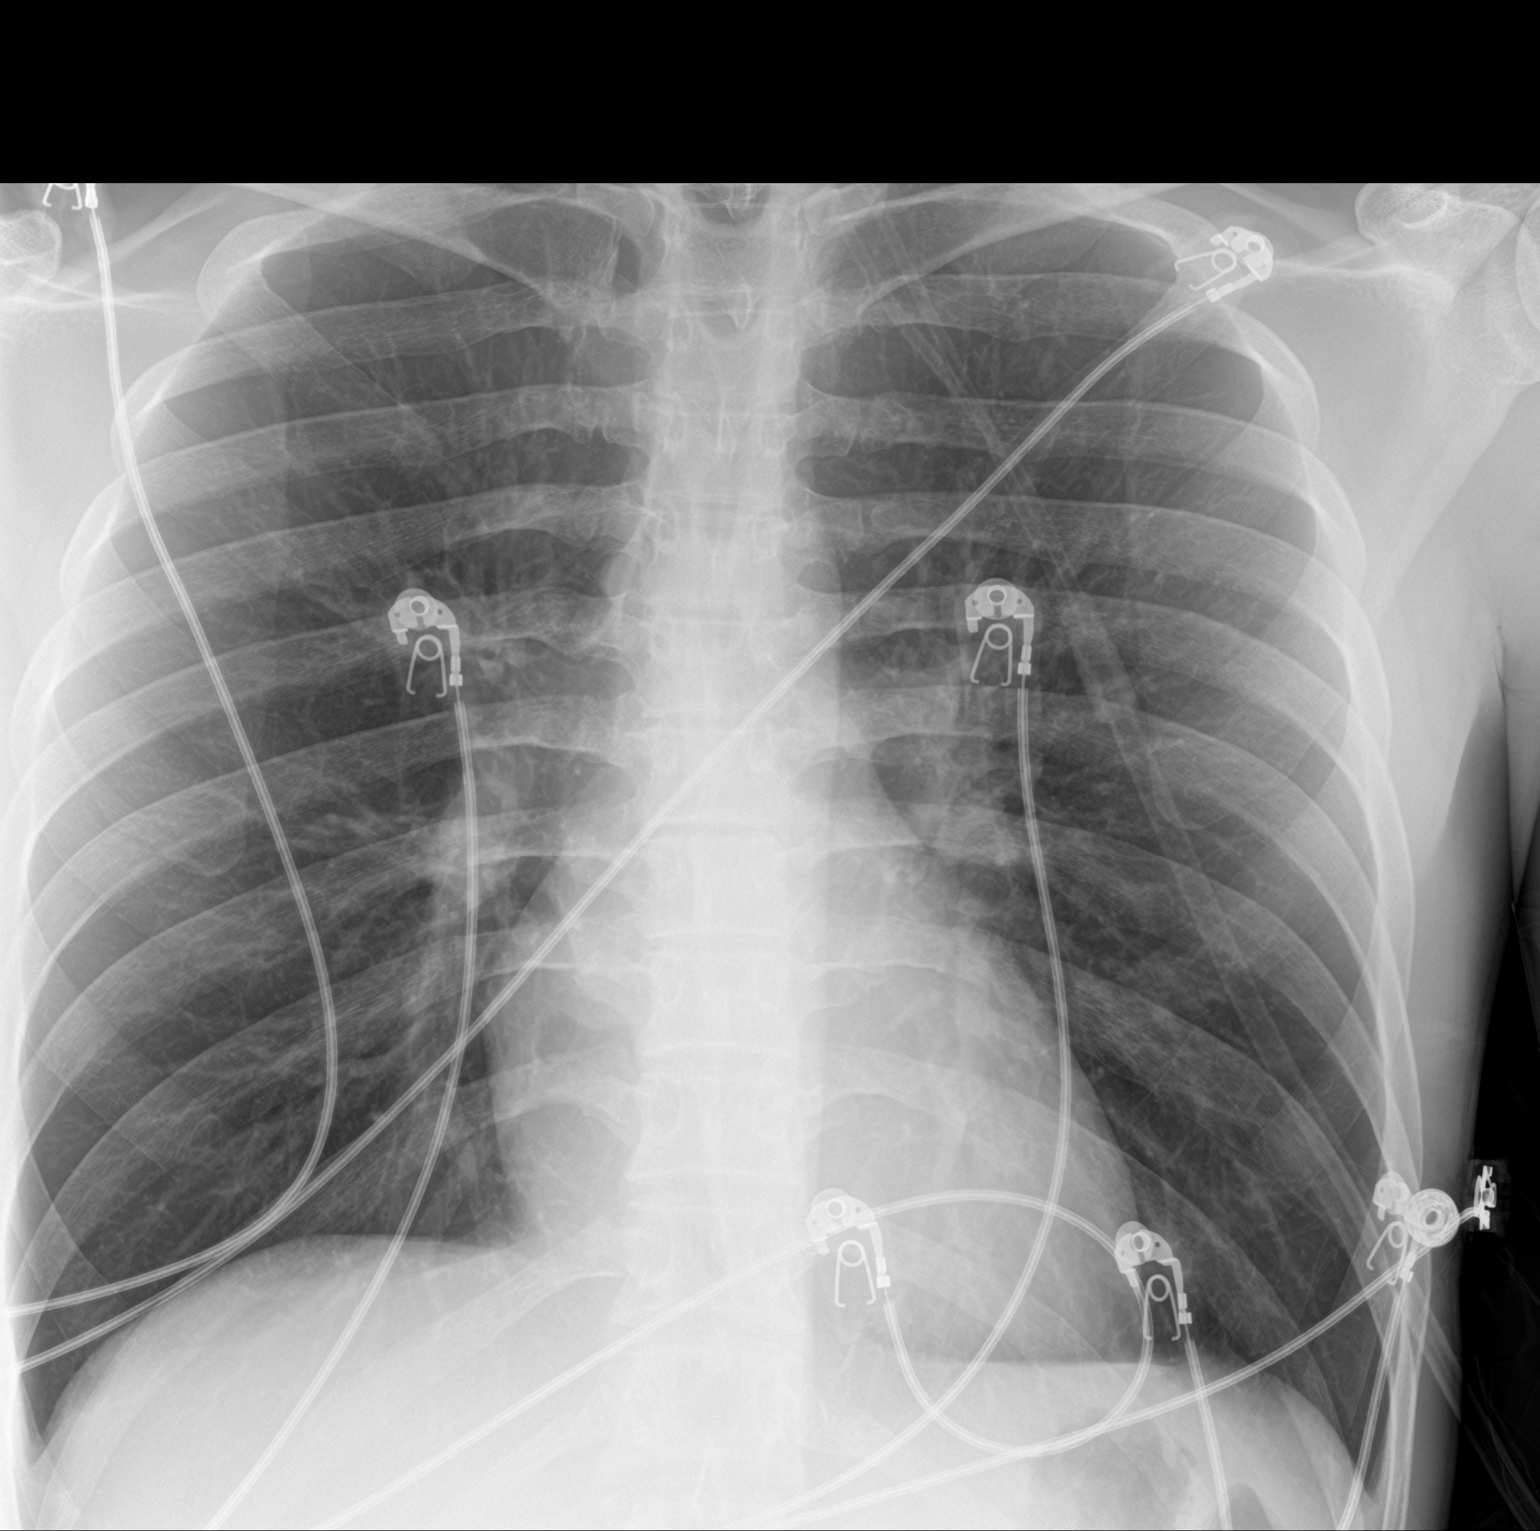

[2 of 2 positions shown; findings below may reference images not displayed]

FINDINGS: The heart size and mediastinal contours are within normal limits.
Both lungs are clear. The visualized skeletal structures are
unremarkable.
IMPRESSION: No active cardiopulmonary disease.
